# Patient Record
Sex: Female | Born: 1990 | Race: White | Hispanic: No | Marital: Married | Smoking: Never smoker
Health system: Southern US, Community
[De-identification: ages and names within clinical notes are randomized; demographics above are authoritative.]

## PROBLEM LIST (undated history)

## (undated) DIAGNOSIS — T7840XA Allergy, unspecified, initial encounter: Secondary | ICD-10-CM

## (undated) HISTORY — DX: Allergy, unspecified, initial encounter: T78.40XA

---
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
## 2010-11-16 ENCOUNTER — Encounter: Payer: Self-pay | Admitting: Internal Medicine

## 2011-06-03 ENCOUNTER — Ambulatory Visit (INDEPENDENT_AMBULATORY_CARE_PROVIDER_SITE_OTHER): Payer: BC Managed Care – PPO | Admitting: Internal Medicine

## 2011-06-03 ENCOUNTER — Encounter: Payer: Self-pay | Admitting: Internal Medicine

## 2011-06-03 DIAGNOSIS — J309 Allergic rhinitis, unspecified: Secondary | ICD-10-CM | POA: Insufficient documentation

## 2011-06-03 DIAGNOSIS — J45909 Unspecified asthma, uncomplicated: Secondary | ICD-10-CM | POA: Insufficient documentation

## 2011-06-03 MED ORDER — METHYLPREDNISOLONE ACETATE 80 MG/ML IJ SUSP
80.0000 mg | Freq: Once | INTRAMUSCULAR | Status: AC
  Administered 2011-06-03: 80 mg via INTRAMUSCULAR

## 2011-06-03 NOTE — Progress Notes (Signed)
Subjective:    Patient ID: Lindsey Glenn, female    DOB: [birth year], 20 y.o.   MRN: [id number]  HPI  New pt. Here for first visit.  Former pt.  Dillon is here with her mom.  She is having a lot of trouble with her allergies and has had to use her albuterol twice in the last 7 days.  Lots of eye itching, nasal congestion  "I sneezed a hundred times last night"  She is on Singulair and cetrizine but these are not helping.  She reports no audible wheezing and does not wake up at night coughing  Allergies  Allergen Reactions  . Penicillins Hives   Past Medical History  Diagnosis Date  . Allergy   . Asthma    Past Surgical History  Procedure Date  . Shoulder surgery 2007    Right   History   Social History  . Marital Status: Married    Spouse Name: N/A    Number of Children: N/A  . Years of Education: N/A   Occupational History  . Not on file.   Social History Main Topics  . Smoking status: Never Smoker   . Smokeless tobacco: Never Used  . Alcohol Use: Yes     socially  . Drug Use: No  . Sexually Active: Yes    Birth Control/ Protection: Pill   Other Topics Concern  . Not on file   Social History Narrative  . No narrative on file   Family History  Problem Relation Age of Onset  . Hypertension Maternal Grandfather   . Heart disease Paternal Grandmother     pacemaker   Patient Active Problem List  Diagnoses  . Asthma  . Allergic rhinitis   No current outpatient prescriptions on file prior to visit.       Review of Systems  See HPI     Objective:   Physical ExamPhysical Exam  Constitutional: She is oriented to person, place, and time. She appears well-developed and well-nourished. She is cooperative.  HENT:  Head: Normocephalic and atraumatic.  Right Ear: A middle ear effusion is present.  Left Ear: A middle ear effusion is present.  Nose: Mucosal edema present. Right sinus exhibits maxillary sinus tenderness. Left sinus exhibits maxillary  sinus tenderness.  Mouth/Throat: Posterior oropharyngeal erythema present.  Serous effusion bilaterally  Eyes: Conjunctivae and EOM are normal. Pupils are equal, round, and reactive to light.  Neck: Neck supple. Carotid bruit is not present. No mass present.  Cardiovascular: Regular rhythm, normal heart sounds, intact distal pulses and normal pulses. Exam reveals no gallop and no friction rub.  No murmur heard.  Pulmonary/Chest: Breath sounds normal. She has no wheezes. She has no rhonchi. She has no rales.  Neurological: She is alert and oriented to person, place, and time.  Skin: Skin is warm and dry. No abrasion, no bruising, no ecchymosis and no rash noted. No cyanosis. Nails show no clubbing.  Psychiatric: She has a normal mood and affect. Her speech is normal and behavior is normal.             Assessment & Plan:  1)  Allergic rhinitis  Will give Flonase nasal spray 2 sprays dialy for 2 weeks then 1 spray daily.  Stop certrizine and take Allegra D 24 hour  2)  Asthma -  Depomedrol 80 mg in office today and Prednisone 40 mg taper by 20 mg  q 3 days.  OK to continue Albuterol for rescue inhaler only  3)  Pharyngitis  Doxycycline 100mg  # 10 1 po bid for  5 days  Return to see me in 3 weeks

## 2011-06-03 NOTE — Patient Instructions (Signed)
Take medicine as prescribed.  Over the counter Allegra-D  24 hour each day at bedtime  See me in office in 2-3 weeks

## 2011-07-06 ENCOUNTER — Other Ambulatory Visit: Payer: Self-pay | Admitting: Internal Medicine

## 2011-07-06 DIAGNOSIS — N946 Dysmenorrhea, unspecified: Secondary | ICD-10-CM

## 2011-07-06 NOTE — Telephone Encounter (Signed)
Pt request refill request for Ibuprofen 800 mg tablets.  [facility] [place] [phone].  Call back number for pt is [phone].

## 2011-07-06 NOTE — Telephone Encounter (Signed)
Pt reports she uses ibuprofen 800mg  PRN menstrual cramping.  [facility]

## 2011-07-07 MED ORDER — IBUPROFEN 800 MG PO TABS: 800.0000 mg | ORAL_TABLET | Freq: Three times a day (TID) | ORAL | Status: DC | PRN

## 2011-08-02 ENCOUNTER — Telehealth: Payer: Self-pay | Admitting: Internal Medicine

## 2011-08-02 ENCOUNTER — Other Ambulatory Visit: Payer: Self-pay | Admitting: Internal Medicine

## 2011-08-02 NOTE — Telephone Encounter (Signed)
Please call my former practice to obtain vaccine records and record in pts chart   Thanks

## 2011-08-03 NOTE — Telephone Encounter (Signed)
Immunization history entered.  

## 2011-08-03 NOTE — Telephone Encounter (Signed)
Spoke with Rodell Perna who will fax vaccination records over

## 2011-08-20 ENCOUNTER — Ambulatory Visit (INDEPENDENT_AMBULATORY_CARE_PROVIDER_SITE_OTHER): Payer: BC Managed Care – PPO | Admitting: Emergency Medicine

## 2011-08-20 DIAGNOSIS — Z23 Encounter for immunization: Secondary | ICD-10-CM

## 2011-08-28 ENCOUNTER — Telehealth: Payer: Self-pay | Admitting: Emergency Medicine

## 2011-08-28 MED ORDER — NORETHINDRONE ACET-ETHINYL EST 1-20 MG-MCG PO TABS: 1.0000 | ORAL_TABLET | Freq: Every day | ORAL | Status: DC

## 2011-08-28 NOTE — Telephone Encounter (Signed)
Lindsey Glenn called, requesting refill of Kamillah's OCP for pick up today.  Okay per DDS to refill x 1 year.  [facility]

## 2012-05-21 ENCOUNTER — Other Ambulatory Visit: Payer: Self-pay | Admitting: Internal Medicine

## 2012-08-03 ENCOUNTER — Other Ambulatory Visit: Payer: Self-pay | Admitting: Internal Medicine

## 2012-08-03 NOTE — Telephone Encounter (Signed)
Pt states her birth control is out and she needs it refill by this upcoming Friday... She would like [facility] [place]... She states one can call her at [phone] if there any questions and concerns.Marland KitchenMarland Kitchen

## 2012-08-04 NOTE — Telephone Encounter (Signed)
Unable to reach pt, phone number disconnected cell phone listed is actually pharmacy #

## 2012-08-04 NOTE — Telephone Encounter (Signed)
Lindsey Glenn  Call pt and let her know that I refilled her OC's but I like to see my patients on birth control pills once a year.  Advise her to see me sometime before the end of the year.  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

## 2012-08-06 NOTE — Telephone Encounter (Signed)
Lindsey Glenn    Just leave a message on pt's home phone number

## 2012-08-17 ENCOUNTER — Telehealth: Payer: Self-pay | Admitting: Internal Medicine

## 2012-08-17 NOTE — Telephone Encounter (Signed)
Pt has problems with body temperature always cold... Would like to know if she should see a doctor... Please call pt at [phone]

## 2013-01-18 ENCOUNTER — Other Ambulatory Visit: Payer: Self-pay | Admitting: Internal Medicine

## 2013-01-18 NOTE — Telephone Encounter (Signed)
Will notify pt that she will need to make an OV to have her BC renewed.

## 2013-01-18 NOTE — Telephone Encounter (Signed)
Refill request

## 2013-01-26 ENCOUNTER — Other Ambulatory Visit: Payer: Self-pay | Admitting: Internal Medicine

## 2013-01-26 MED ORDER — NORETHINDRONE ACET-ETHINYL EST 1-20 MG-MCG PO TABS: ORAL_TABLET | ORAL | Status: DC

## 2013-03-09 ENCOUNTER — Ambulatory Visit (INDEPENDENT_AMBULATORY_CARE_PROVIDER_SITE_OTHER): Payer: BC Managed Care – PPO | Admitting: Internal Medicine

## 2013-03-09 ENCOUNTER — Encounter: Payer: Self-pay | Admitting: Internal Medicine

## 2013-03-09 VITALS — BP 101/74 | HR 63 | Temp 98.7°F | Ht 65.5 in | Wt 112.0 lb

## 2013-03-09 DIAGNOSIS — J45990 Exercise induced bronchospasm: Secondary | ICD-10-CM | POA: Insufficient documentation

## 2013-03-09 DIAGNOSIS — N946 Dysmenorrhea, unspecified: Secondary | ICD-10-CM

## 2013-03-09 LAB — CBC WITH DIFFERENTIAL/PLATELET
Basophils Absolute: 0 10*3/uL (ref 0.0–0.1)
Basophils Relative: 0 % (ref 0–1)
Eosinophils Relative: 2 % (ref 0–5)
HCT: 38 % (ref 36.0–46.0)
Hemoglobin: 13.1 g/dL (ref 12.0–15.0)
Monocytes Absolute: 0.3 10*3/uL (ref 0.1–1.0)
RBC: 4.38 MIL/uL (ref 3.87–5.11)
RDW: 13.6 % (ref 11.5–15.5)
WBC: 4.8 10*3/uL (ref 4.0–10.5)

## 2013-03-09 LAB — COMPREHENSIVE METABOLIC PANEL
Albumin: 4.1 g/dL (ref 3.5–5.2)
Alkaline Phosphatase: 32 U/L — ABNORMAL LOW (ref 39–117)
BUN: 12 mg/dL (ref 6–23)
Creat: 0.86 mg/dL (ref 0.50–1.10)
Glucose, Bld: 80 mg/dL (ref 70–99)
Potassium: 4.1 mEq/L (ref 3.5–5.3)
Total Protein: 6.6 g/dL (ref 6.0–8.3)

## 2013-03-09 LAB — TSH: TSH: 0.982 u[IU]/mL (ref 0.350–4.500)

## 2013-03-09 LAB — LIPID PANEL
LDL Cholesterol: 133 mg/dL — ABNORMAL HIGH (ref 0–99)
VLDL: 23 mg/dL (ref 0–40)

## 2013-03-09 MED ORDER — NORETHINDRONE ACET-ETHINYL EST 1-20 MG-MCG PO TABS: ORAL_TABLET | ORAL | Status: DC

## 2013-03-09 MED ORDER — ALBUTEROL SULFATE HFA 108 (90 BASE) MCG/ACT IN AERS: 2.0000 | INHALATION_SPRAY | Freq: Four times a day (QID) | RESPIRATORY_TRACT | Status: DC | PRN

## 2013-03-09 NOTE — Progress Notes (Signed)
Needs refill on birth control and inhaler

## 2013-03-09 NOTE — Progress Notes (Signed)
Subjective:    Patient ID: Lindsey Glenn, female    DOB: [birth year], 22 y.o.   MRN: [id number]  HPI  [name] is here for follow up.   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  NO real pain.  She is not in a sexual relationship now but has been at college.  She was using condoms for STD protection  Has rarely needed her proventil.  Uses at times prior to exercise   Allergies  Allergen Reactions  . Penicillins Hives  . Cherry Rash   Past Medical History  Diagnosis Date  . Allergy   . Asthma    Past Surgical History  Procedure Laterality Date  . Shoulder surgery  2007    Right   History   Social History  . Marital Status: Married    Spouse Name: N/A    Number of Children: N/A  . Years of Education: N/A   Occupational History  . Not on file.   Social History Main Topics  . Smoking status: Never Smoker   . Smokeless tobacco: Never Used  . Alcohol Use: Yes     Comment: socially  . Drug Use: No  . Sexually Active: Yes    Birth Control/ Protection: Pill   Other Topics Concern  . Not on file   Social History Narrative  . No narrative on file   Family History  Problem Relation Age of Onset  . Hypertension Maternal Grandfather   . Heart disease Paternal Grandmother     pacemaker  . Hyperlipidemia Mother    Patient Active Problem List   Diagnosis Date Noted  . Asthma 06/03/2011  . Allergic rhinitis 06/03/2011   Current Outpatient Prescriptions on File Prior to Visit  Medication Sig Dispense Refill  . albuterol (PROVENTIL,VENTOLIN) 90 MCG/ACT inhaler Inhale 2 puffs into the lungs every 6 (six) hours as needed.        . cetirizine (ZYRTEC) 10 MG tablet Take 10 mg by mouth daily as needed.       Marland Kitchen ibuprofen (ADVIL,MOTRIN) 800 MG tablet Take 1 tablet (800 mg total) by mouth every 8  (eight) hours as needed. Menstrual cramps  30 tablet  2  . montelukast (SINGULAIR) 10 MG tablet Take 10 mg by mouth daily as needed.        No current facility-administered medications on file prior to visit.        Review of Systems    see HPI Objective:   Physical Exam Physical Exam  Nursing note and vitals reviewed.  Constitutional: She is oriented to person, place, and time. She appears well-developed and well-nourished.  HENT:  Head: Normocephalic and atraumatic.  Cardiovascular: Normal rate and regular rhythm. Exam reveals no gallop and no friction rub.  No murmur heard.  Pulmonary/Chest: Breath sounds normal. She has no wheezes. She has no rales.  Neurological: She is alert and oriented to person, place, and time.  Skin: Skin is warm and dry.  Psychiatric: She has a normal mood and affect. Her behavior is normal.             Assessment & Plan:  Dysmenorrhea  OK to continue Junel.  Pt counseled if any leg swelling or pain she is to call office.   She is aware of risk of blood clots  Exercise induced bronchospasm.  OK to refill Proventil  Will return next week for pap

## 2013-03-13 ENCOUNTER — Telehealth: Payer: Self-pay | Admitting: *Deleted

## 2013-03-13 ENCOUNTER — Encounter: Payer: Self-pay | Admitting: *Deleted

## 2013-03-13 MED ORDER — NORETHINDRONE ACET-ETHINYL EST 1-20 MG-MCG PO TABS: ORAL_TABLET | ORAL | Status: DC

## 2013-03-13 NOTE — Telephone Encounter (Signed)
Refill request pharmacy requested a 90 day supply changed RX for 90 tablets with 2 refills vs 30 tab with 5 refills

## 2013-03-16 ENCOUNTER — Ambulatory Visit (INDEPENDENT_AMBULATORY_CARE_PROVIDER_SITE_OTHER): Payer: BC Managed Care – PPO | Admitting: Internal Medicine

## 2013-03-16 ENCOUNTER — Encounter: Payer: Self-pay | Admitting: Internal Medicine

## 2013-03-16 VITALS — BP 95/62 | HR 65 | Temp 97.1°F | Resp 18 | Wt 107.0 lb

## 2013-03-16 DIAGNOSIS — Z01419 Encounter for gynecological examination (general) (routine) without abnormal findings: Secondary | ICD-10-CM

## 2013-03-16 DIAGNOSIS — Z124 Encounter for screening for malignant neoplasm of cervix: Secondary | ICD-10-CM

## 2013-03-16 DIAGNOSIS — Z113 Encounter for screening for infections with a predominantly sexual mode of transmission: Secondary | ICD-10-CM

## 2013-03-16 NOTE — Progress Notes (Signed)
Subjective:    Patient ID: Lindsey Glenn, female    DOB: 10/27/1990, 22 y.o.   MRN: [id number]  HPI Lindsey Glenn is here for annual pelvic.  She reports she is not sexually active now.  She is on last days of current menses.  No pain.    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 Penicillins Hives  . Cherry Rash   Past Medical History  Diagnosis Date  . Allergy   . Asthma    Past Surgical History  Procedure Laterality Date  . Shoulder surgery  2007    Right   History   Social History  . Marital Status: Married    Spouse Name: N/A    Number of Children: N/A  . Years of Education: N/A   Occupational History  . Not on file.   Social History Main Topics  . Smoking status: Never Smoker   . Smokeless tobacco: Never Used  . Alcohol Use: Yes     Comment: socially  . Drug Use: No  . Sexually Active: Yes    Birth Control/ Protection: Pill   Other Topics Concern  . Not on file   Social History Narrative  . No narrative on file   Family History  Problem Relation Age of Onset  . Hypertension Maternal Grandfather   . Heart disease Paternal Grandmother     pacemaker  . Hyperlipidemia Mother    Patient Active Problem List   Diagnosis Date Noted  . 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 Asthma 06/03/2011  . Allergic rhinitis 06/03/2011   Current Outpatient Prescriptions on File Prior to Visit  Medication Sig Dispense Refill  . albuterol (PROVENTIL HFA;VENTOLIN HFA) 108 (90 BASE) MCG/ACT inhaler Inhale 2 puffs into the lungs every 6 (six) hours as needed for wheezing.  1 Inhaler  2  . albuterol (PROVENTIL,VENTOLIN) 90 MCG/ACT inhaler Inhale 2 puffs into the lungs every 6 (six) hours as needed.        . cetirizine (ZYRTEC) 10 MG tablet Take 10 mg by mouth daily as needed.       . norethindrone-ethinyl estradiol (JUNEL 1/20) 1-20 MG-MCG tablet Take one daily  90 tablet  2  . ibuprofen  (ADVIL,MOTRIN) 800 MG tablet Take 1 tablet (800 mg total) by mouth every 8 (eight) hours as needed. Menstrual cramps  30 tablet  2   No current facility-administered medications on file prior to visit.       Review of Systems See HPI    Objective:   Physical Exam Physical Exam  Nursing note and vitals reviewed.  Constitutional: She is oriented to person, place, and time. She appears well-developed and well-nourished.  HENT:  Head: Normocephalic and atraumatic.  Cardiovascular: Normal rate and regular rhythm. Exam reveals no gallop and no friction rub.  No murmur heard.  Pulmonary/Chest: Breath sounds normal. She has no wheezes. She has no rales.  Pelvic:  Normal  external genitalia Vagina no lesion Cervix  No lesions pap obtained No adnexal masses or tenderness Neurological: She is alert and oriented to person, place, and time.  Skin: Skin is warm and dry.  Psychiatric: She has a normal mood and affect. Her behavior is normal.             Assessment & Plan:  Annual exam  Pap, G/C chlamydia today  Counseled safer sex practices/condom use.   Will reorder her OC's  See me as needed 

## 2013-03-16 NOTE — Patient Instructions (Addendum)
See me as needed 

## 2013-03-23 ENCOUNTER — Encounter: Payer: Self-pay | Admitting: *Deleted

## 2013-03-28 ENCOUNTER — Ambulatory Visit: Payer: Self-pay

## 2013-03-28 ENCOUNTER — Other Ambulatory Visit: Payer: Self-pay | Admitting: Occupational Medicine

## 2013-03-28 DIAGNOSIS — R52 Pain, unspecified: Secondary | ICD-10-CM

## 2013-08-07 ENCOUNTER — Ambulatory Visit (INDEPENDENT_AMBULATORY_CARE_PROVIDER_SITE_OTHER): Payer: BC Managed Care – PPO | Admitting: *Deleted

## 2013-08-07 DIAGNOSIS — Z23 Encounter for immunization: Secondary | ICD-10-CM

## 2013-08-07 NOTE — Addendum Note (Signed)
Addended by: Clint Bolder D on: 08/07/2013 04:30 PM   Modules accepted: Orders

## 2013-08-09 ENCOUNTER — Telehealth: Payer: Self-pay | Admitting: *Deleted

## 2013-08-09 ENCOUNTER — Encounter: Payer: Self-pay | Admitting: Family

## 2013-08-09 ENCOUNTER — Ambulatory Visit (INDEPENDENT_AMBULATORY_CARE_PROVIDER_SITE_OTHER): Payer: BC Managed Care – PPO | Admitting: Family

## 2013-08-09 VITALS — BP 88/68 | HR 81 | Temp 98.4°F | Resp 16 | Wt 106.1 lb

## 2013-08-09 DIAGNOSIS — A088 Other specified intestinal infections: Secondary | ICD-10-CM

## 2013-08-09 DIAGNOSIS — A084 Viral intestinal infection, unspecified: Secondary | ICD-10-CM

## 2013-08-09 MED ORDER — ONDANSETRON HCL 4 MG PO TABS: 4.0000 mg | ORAL_TABLET | Freq: Three times a day (TID) | ORAL | Status: DC | PRN

## 2013-08-09 NOTE — Progress Notes (Signed)
Subjective:    Patient ID: Lindsey Glenn, female    DOB: 11-Oct-1991, 22 y.o.   MRN: [id number]  HPI  Ms. Ryen is a 22 yr old female patient of Dr. Lonell Face who presents today with chief complaint of nausea.  Reports that she woke up at 3 AM with nausea and vomiting. She noted brief sob and chest discomfort which was associated with the vomiting and has resolved.  She did have chills and felt feverish but did not take her temperature.  She denies associated abdominal pain, dysuria, back pain, cough or known sick contacts.  She reports that she drove to [place], [place] last week and flew to [place] the week before. She denies calf pain/swelling.  She is not sure re: family hx of DVT.  She took ondansetron earlier today.  This helped her nausea some.     Review of Systems See HPI  Past Medical History  Diagnosis Date  . Allergy   . Asthma     History   Social History  . Marital Status: Married    Spouse Name: N/A    Number of Children: N/A  . Years of Education: N/A   Occupational History  . Not on file.   Social History Main Topics  . Smoking status: Never Smoker   . Smokeless tobacco: Never Used  . Alcohol Use: Yes     Comment: socially  . Drug Use: No  . Sexual Activity: Yes    Birth Control/ Protection: Pill   Other Topics Concern  . Not on file   Social History Narrative  . No narrative on file    Past Surgical History  Procedure Laterality Date  . Shoulder surgery  2007    Right    Family History  Problem Relation Age of Onset  . Hypertension Maternal Grandfather   . Heart disease Paternal Grandmother     pacemaker  . Hyperlipidemia Mother     Allergies  Allergen Reactions  . Penicillins Hives  . Cherry Rash    Current Outpatient Prescriptions on File Prior to Visit  Medication Sig Dispense Refill  . albuterol (PROVENTIL HFA;VENTOLIN HFA) 108 (90 BASE) MCG/ACT inhaler Inhale 2 puffs into the lungs every 6 (six) hours as needed for wheezing.  1  Inhaler  2  . norethindrone-ethinyl estradiol (JUNEL 1/20) 1-20 MG-MCG tablet Take one daily  90 tablet  2  . cetirizine (ZYRTEC) 10 MG tablet Take 10 mg by mouth daily as needed.       Marland Kitchen ibuprofen (ADVIL,MOTRIN) 800 MG tablet Take 1 tablet (800 mg total) by mouth every 8 (eight) hours as needed. Menstrual cramps  30 tablet  2   No current facility-administered medications on file prior to visit.    BP 88/68  Pulse 81  Temp(Src) 98.4 F (36.9 C) (Oral)  Resp 16  Wt 106 lb 1.3 oz (48.118 kg)  BMI 17.38 kg/m2  SpO2 99%       Objective:   Physical Exam  Constitutional: She is oriented to person, place, and time. She appears well-developed and well-nourished.  HENT:  Head: Normocephalic and atraumatic.  Right Ear: Tympanic membrane and ear canal normal.  Left Ear: Tympanic membrane and ear canal normal.  Neck:  Slightly prominent thyroid, no palpable nodules noted.   Cardiovascular: Normal rate and regular rhythm.   No murmur heard. Pulmonary/Chest: Effort normal and breath sounds normal. No respiratory distress. She has no wheezes. She has no rales. She exhibits no tenderness.  Neurological: She is alert and oriented to person, place, and time.  Skin: Skin is warm and dry.  Psychiatric: She has a normal mood and affect. Her behavior is normal. Judgment and thought content normal.          Assessment & Plan:

## 2013-08-09 NOTE — Telephone Encounter (Signed)
Pt woke this am with N/V chills and fever pt had the flu vaccination on Monday appt with Macario Carls this am at 945. Mother contacted

## 2013-08-09 NOTE — Patient Instructions (Signed)
Viral Gastroenteritis Viral gastroenteritis is also known as stomach flu. This condition affects the stomach and intestinal tract. It can cause sudden diarrhea and vomiting. The illness typically lasts 3 to 8 days. Most people develop an immune response that eventually gets rid of the virus. While this natural response develops, the virus can make you quite ill. CAUSES  Many different viruses can cause gastroenteritis, such as rotavirus or noroviruses. You can catch one of these viruses by consuming contaminated food or water. You may also catch a virus by sharing utensils or other personal items with an infected person or by touching a contaminated surface. SYMPTOMS  The most common symptoms are diarrhea and vomiting. These problems can cause a severe loss of body fluids (dehydration) and a body salt (electrolyte) imbalance. Other symptoms may include:  Fever.  Headache.  Fatigue.  Abdominal pain. DIAGNOSIS  Your caregiver can usually diagnose viral gastroenteritis based on your symptoms and a physical exam. A stool sample may also be taken to test for the presence of viruses or other infections. TREATMENT  This illness typically goes away on its own. Treatments are aimed at rehydration. The most serious cases of viral gastroenteritis involve vomiting so severely that you are not able to keep fluids down. In these cases, fluids must be given through an intravenous line (IV). HOME CARE INSTRUCTIONS   Drink enough fluids to keep your urine clear or pale yellow. Drink small amounts of fluids frequently and increase the amounts as tolerated.  Ask your caregiver for specific rehydration instructions.  Avoid:  Foods high in sugar.  Alcohol.  Carbonated drinks.  Tobacco.  Juice.  Caffeine drinks.  Extremely hot or cold fluids.  Fatty, greasy foods.  Too much intake of anything at one time.  Dairy products until 24 to 48 hours after diarrhea stops.  You may consume probiotics.  Probiotics are active cultures of beneficial bacteria. They may lessen the amount and number of diarrheal stools in adults. Probiotics can be found in yogurt with active cultures and in supplements.  Wash your hands well to avoid spreading the virus.  Only take over-the-counter or prescription medicines for pain, discomfort, or fever as directed by your caregiver. Do not give aspirin to children. Antidiarrheal medicines are not recommended.  Ask your caregiver if you should continue to take your regular prescribed and over-the-counter medicines.  Keep all follow-up appointments as directed by your caregiver. SEEK IMMEDIATE MEDICAL CARE IF:   You are unable to keep fluids down.  You do not urinate at least once every 6 to 8 hours.  You develop shortness of breath.  You notice blood in your stool or vomit. This may look like coffee grounds.  You have abdominal pain that increases or is concentrated in one small area (localized).  You have persistent vomiting or diarrhea.  You have a fever.  The patient is a child younger than 3 months, and he or she has a fever.  The patient is a child older than 3 months, and he or she has a fever and persistent symptoms.  The patient is a child older than 3 months, and he or she has a fever and symptoms suddenly get worse.  The patient is a baby, and he or she has no tears when crying. MAKE SURE YOU:   Understand these instructions.  Will watch your condition.  Will get help right away if you are not doing well or get worse. Document Released: 10/12/2005 Document Revised: 01/04/2012 Document Reviewed: 07/29/2011   [employer] Patient Information 2014 [employer]  

## 2013-08-09 NOTE — Assessment & Plan Note (Signed)
22 yr old female with symptoms consistent with viral gastroenteritis.  Recommend that she continue zofran prn, frequent small sips of liquids.  Tylenol or motrin as needed for comfort/fever.  We did discuss that if she has recurrent chest pain or shortness of breath she should notify us as we would want to exclude PE and she verbalizes understanding  However, at this point, I think PE is highly unlikely and that her brief sob and chest discomfort was a direct result of vomitting.

## 2013-09-28 ENCOUNTER — Encounter: Payer: Self-pay | Admitting: Internal Medicine

## 2013-09-28 ENCOUNTER — Ambulatory Visit (INDEPENDENT_AMBULATORY_CARE_PROVIDER_SITE_OTHER): Payer: BC Managed Care – PPO | Admitting: Internal Medicine

## 2013-09-28 VITALS — BP 92/63 | HR 57 | Temp 98.1°F | Resp 18 | Wt 108.0 lb

## 2013-09-28 DIAGNOSIS — L989 Disorder of the skin and subcutaneous tissue, unspecified: Secondary | ICD-10-CM

## 2013-09-28 DIAGNOSIS — J45990 Exercise induced bronchospasm: Secondary | ICD-10-CM

## 2013-09-28 MED ORDER — ALBUTEROL SULFATE HFA 108 (90 BASE) MCG/ACT IN AERS: 2.0000 | INHALATION_SPRAY | Freq: Four times a day (QID) | RESPIRATORY_TRACT | Status: AC | PRN

## 2013-09-28 MED ORDER — NORETHINDRONE ACET-ETHINYL EST 1-20 MG-MCG PO TABS: ORAL_TABLET | ORAL | Status: DC

## 2013-09-28 NOTE — Progress Notes (Signed)
Subjective:    Patient ID: Lindsey Glenn, female    DOB: 1991/09/03, 22 y.o.   MRN: [id number]  HPI Jashayla is here for acute visit for two skin lesions and to discuss her exercise induced bronchospasm  She has  Papule on her Leftd hip that has been present for some time .   Not reddened not increasing in size.     She has also noticed  A small lesion Top or her R leg in groin area that has been there for quite some time  She reports she needs to use her albuterol when her father smokes  a cigar every Sunday.  Occasionally will need albuterol when going into cold air.  Last use  5 days ago when exposed to cigar smoke  Allergies  Allergen Reactions  . Penicillins Hives  . Cherry Rash   Past Medical History  Diagnosis Date  . Allergy   . Asthma    Past Surgical History  Procedure Laterality Date  . Shoulder surgery  2007    Right   History   Social History  . Marital Status: Married    Spouse Name: N/A    Number of Children: N/A  . Years of Education: N/A   Occupational History  . Not on file.   Social History Main Topics  . Smoking status: Never Smoker   . Smokeless tobacco: Never Used  . Alcohol Use: Yes     Comment: socially  . Drug Use: No  . Sexual Activity: Yes    Birth Control/ Protection: Pill   Other Topics Concern  . Not on file   Social History Narrative  . No narrative on file   Family History  Problem Relation Age of Onset  . Hypertension Maternal Grandfather   . Heart disease Paternal Grandmother     pacemaker  . Hyperlipidemia Mother    Patient Active Problem List   Diagnosis Date Noted  . Viral gastroenteritis 08/09/2013  . 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 Asthma 06/03/2011  . Allergic rhinitis 06/03/2011   Current Outpatient Prescriptions on File Prior to Visit  Medication Sig Dispense Refill  . Acetaminophen (TYLENOL EXTRA STRENGTH PO) Take by mouth as needed.      Marland Kitchen albuterol (PROVENTIL HFA;VENTOLIN HFA) 108  (90 BASE) MCG/ACT inhaler Inhale 2 puffs into the lungs every 6 (six) hours as needed for wheezing.  1 Inhaler  2  . fexofenadine-pseudoephedrine (ALLEGRA-D 24) 180-240 MG per 24 hr tablet Take 1 tablet by mouth daily as needed.      Marland Kitchen ibuprofen (ADVIL,MOTRIN) 800 MG tablet Take 1 tablet (800 mg total) by mouth every 8 (eight) hours as needed. Menstrual cramps  30 tablet  2  . norethindrone-ethinyl estradiol (JUNEL 1/20) 1-20 MG-MCG tablet Take one daily  90 tablet  2  . cetirizine (ZYRTEC) 10 MG tablet Take 10 mg by mouth daily as needed.       . ondansetron (ZOFRAN) 4 MG tablet Take 1 tablet (4 mg total) by mouth every 8 (eight) hours as needed for nausea.  20 tablet  0   No current facility-administered medications on file prior to visit.      Review of Systems See HPI    Objective:   Physical Exam  Physical Exam  Nursing note and vitals reviewed.  Constitutional: She is oriented to person, place, and time. She appears well-developed and well-nourished.  HENT:  Head: Normocephalic and atraumatic.  Cardiovascular: Normal rate and regular rhythm. Exam  reveals no gallop and no friction rub.  No murmur heard.  Pulmonary/Chest: Breath sounds normal. She has no wheezes. She has no rales.  Neurological: She is alert and oriented to person, place, and time.  Skin: Skin is warm and dry.  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 Her behavior is normal.            Assessment & Plan:  Skin lesion:  She would like to see a dermatologist    OK to refer    Exercise induced Asthma:    OK to refill Proventil.  Advised if using rescue inhaler more than 3 times in 24 hours, to see me in office  See me as needed

## 2013-09-28 NOTE — Patient Instructions (Signed)
Will refer to dermatologist   Dr. Emily Filbert or Dr. Danella Deis    See me as needed

## 2013-11-22 ENCOUNTER — Other Ambulatory Visit: Payer: Self-pay | Admitting: Internal Medicine

## 2013-11-22 NOTE — Telephone Encounter (Signed)
Refill request

## 2014-01-19 ENCOUNTER — Telehealth: Payer: Self-pay | Admitting: *Deleted

## 2014-01-19 NOTE — Telephone Encounter (Signed)
Pt called wanting a RX  For steroids and an inhaler after [facility]. Pt was told to contact her PCP for RX. Notified pt that Dr Coralyn Mark was not in the office today and that she will need to be seen at [facility] where they could write her a RX for the appropriate medications, as Dr Coralyn Mark does not prescribe medications unless she has evaluated pt. Pt verbalized understanding, and be seen at an UC today.

## 2014-01-22 ENCOUNTER — Telehealth: Payer: Self-pay | Admitting: *Deleted

## 2014-01-22 NOTE — Telephone Encounter (Signed)
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  The doctor wanted her to call her primary care and see if we would call in a rx for steroids to help her get over reaction to cat.

## 2014-02-07 ENCOUNTER — Ambulatory Visit (INDEPENDENT_AMBULATORY_CARE_PROVIDER_SITE_OTHER): Payer: BC Managed Care – PPO | Admitting: Podiatry

## 2014-02-07 ENCOUNTER — Encounter: Payer: Self-pay | Admitting: Podiatry

## 2014-02-07 VITALS — BP 109/65 | HR 74 | Ht 65.5 in | Wt 108.0 lb

## 2014-02-07 DIAGNOSIS — L600 Ingrowing nail: Secondary | ICD-10-CM

## 2014-02-07 DIAGNOSIS — D1800 Hemangioma unspecified site: Secondary | ICD-10-CM

## 2014-02-07 DIAGNOSIS — M79609 Pain in unspecified limb: Secondary | ICD-10-CM

## 2014-02-07 DIAGNOSIS — L03039 Cellulitis of unspecified toe: Secondary | ICD-10-CM

## 2014-02-07 DIAGNOSIS — M79674 Pain in right toe(s): Secondary | ICD-10-CM

## 2014-02-07 NOTE — Patient Instructions (Signed)
Seen for abnormal nail on right great toe. Nail and small growth tissue removed.  Keep bandage till the next appointment Monday.

## 2014-02-07 NOTE — Progress Notes (Signed)
Subjective: 23 year old female presents accompanied by her mother with a painful toe. She has an unusual bleeding spot at the base of the right great toe nail  She recall having an injury to the toe a few weeks ago.  Objective: Positive of red granular growth protruding from the dorsal aspect of  nail base at proximal skin folder with moist nail base. No foul odor. No expanded edema or erythema associated with the lesion. Neurovascular status are within normal. Rectus foot without growth deformity.  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   Plan: Reviewed clinical findings and available options. Patient and her mother agreed to have it excised or removed surgically.  Procedure done: Excision of glomus tumor with nail avulsion right hallux. Affected toe was anesthetized with total 16ml mixture of 50/50 0.5% Marcaine plain and 1% Xylocaine plain. Affected nail plate was reflected with nail elevator and plate was removed.  The gross was noted to be from dorsal surface of skin folder of nail base line. Proximal skin incision was placed along the line of medical nail border and visualized the nail matrix area.  Area was examined and noted of soft tissue growth from the proximal skin folder.  This growth was expanded about 70% of the base line of skin folder. Soft tissue was excised with # 15 blade. Area cleansed with Iodine. Medial skin incision was sutured with 4/0 Nylon and followed with compression bandage with Amerigel ointment.  Patient was instructed not to disturb the bandage. Will follow up in 5 days, next Monday.

## 2014-02-08 ENCOUNTER — Telehealth: Payer: Self-pay | Admitting: *Deleted

## 2014-02-08 NOTE — Telephone Encounter (Signed)
Dr. Caffie Pinto, The Rx written couldn't be filled, could you change Please?

## 2014-02-12 ENCOUNTER — Ambulatory Visit (INDEPENDENT_AMBULATORY_CARE_PROVIDER_SITE_OTHER): Payer: BC Managed Care – PPO | Admitting: Podiatry

## 2014-02-12 ENCOUNTER — Encounter: Payer: Self-pay | Admitting: Podiatry

## 2014-02-12 VITALS — BP 132/85 | HR 88

## 2014-02-12 DIAGNOSIS — D1800 Hemangioma unspecified site: Secondary | ICD-10-CM

## 2014-02-12 MED ORDER — HYDROCODONE-ACETAMINOPHEN 7.5-325 MG PO TABS: 1.0000 | ORAL_TABLET | Freq: Four times a day (QID) | ORAL | Status: DC | PRN

## 2014-02-12 NOTE — Patient Instructions (Signed)
Post op check. Wound is still fresh and wet from school incident.  Will remove suture this Friday. May get it wet before come for the appointment.

## 2014-02-12 NOTE — Progress Notes (Signed)
Been having pain. Was doing well till a child stepped on her toe today.  Dressing removed. Base of the nail is still bleeding at the suture site. Dressing is adhered to the nail bed.  Assessment: Status post nail and excision of soft tissue matter.  Dressing changed. Will remove suture on next visit. Patient is to keep the dressing another 4 days.

## 2014-02-15 ENCOUNTER — Encounter: Payer: Self-pay | Admitting: *Deleted

## 2014-02-16 ENCOUNTER — Encounter: Payer: Self-pay | Admitting: Podiatry

## 2014-02-16 ENCOUNTER — Ambulatory Visit (INDEPENDENT_AMBULATORY_CARE_PROVIDER_SITE_OTHER): Payer: BC Managed Care – PPO | Admitting: Podiatry

## 2014-02-16 VITALS — BP 114/66 | HR 61

## 2014-02-16 DIAGNOSIS — Z9889 Other specified postprocedural states: Secondary | ICD-10-CM

## 2014-02-16 NOTE — Patient Instructions (Signed)
Healing well. Continue to cover the area with band aid and antibiotic ointment till tenderness subside. Return as needed.

## 2014-02-16 NOTE — Progress Notes (Signed)
Patient came back to have sutures removed from right great toe nail surgery. Wound has healed well. Still gauze stuck to nail bed.  Suture removed. Patient is to keep Neosporin ointment to nail bed and cover it till pain subside. Return if there is any problem.

## 2014-02-27 ENCOUNTER — Encounter: Payer: Self-pay | Admitting: Podiatry

## 2014-02-27 ENCOUNTER — Ambulatory Visit (INDEPENDENT_AMBULATORY_CARE_PROVIDER_SITE_OTHER): Payer: BC Managed Care – PPO | Admitting: Podiatry

## 2014-02-27 VITALS — BP 119/72 | HR 70

## 2014-02-27 DIAGNOSIS — Z9889 Other specified postprocedural states: Secondary | ICD-10-CM

## 2014-02-27 NOTE — Progress Notes (Signed)
Patient was concerned about nail bed that had brown scab. Area cleansed.  Adhered old dressing removed. Good wound healing with new nail bed noted. Return as needed.

## 2014-02-27 NOTE — Patient Instructions (Signed)
Follow up since nail surgery. Area healing well. Return if needed.

## 2014-03-05 ENCOUNTER — Telehealth: Payer: Self-pay | Admitting: *Deleted

## 2014-03-05 NOTE — Telephone Encounter (Signed)
03/05/14 Dr. Caffie Pinto , Pt wants to know if she can get in pool just to cool off. She had a toenail removed a few weeks ago and it hasn't grew back and pt wanted to make sure this is ok. Please ask the Dr. She said.

## 2014-08-16 ENCOUNTER — Ambulatory Visit (INDEPENDENT_AMBULATORY_CARE_PROVIDER_SITE_OTHER): Payer: BC Managed Care – PPO | Admitting: *Deleted

## 2014-08-16 DIAGNOSIS — Z23 Encounter for immunization: Secondary | ICD-10-CM

## 2014-09-10 ENCOUNTER — Telehealth: Payer: Self-pay

## 2014-09-10 NOTE — Telephone Encounter (Signed)
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

## 2014-09-12 ENCOUNTER — Ambulatory Visit (INDEPENDENT_AMBULATORY_CARE_PROVIDER_SITE_OTHER): Payer: BC Managed Care – PPO | Admitting: Internal Medicine

## 2014-09-12 ENCOUNTER — Encounter: Payer: Self-pay | Admitting: Internal Medicine

## 2014-09-12 VITALS — BP 106/67 | HR 99 | Temp 98.3°F | Resp 16 | Ht 65.5 in | Wt 114.0 lb

## 2014-09-12 DIAGNOSIS — Z0100 Encounter for examination of eyes and vision without abnormal findings: Secondary | ICD-10-CM

## 2014-09-12 DIAGNOSIS — J4520 Mild intermittent asthma, uncomplicated: Secondary | ICD-10-CM | POA: Diagnosis not present

## 2014-09-12 DIAGNOSIS — Z0110 Encounter for examination of ears and hearing without abnormal findings: Secondary | ICD-10-CM

## 2014-09-12 DIAGNOSIS — N946 Dysmenorrhea, unspecified: Secondary | ICD-10-CM | POA: Diagnosis not present

## 2014-09-12 DIAGNOSIS — Z111 Encounter for screening for respiratory tuberculosis: Secondary | ICD-10-CM

## 2014-09-12 NOTE — Patient Instructions (Signed)
Schedule CPE

## 2014-09-12 NOTE — Progress Notes (Signed)
Subjective:    Patient ID: Lindsey Glenn, female    DOB: Feb 28, 1991, 23 y.o.   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  School quite dusty      Allergies  Allergen Reactions  . Penicillins Hives  . Cherry Rash  . Red Dye Rash   Past Medical History  Diagnosis Date  . Allergy   . Asthma    Past Surgical History  Procedure Laterality Date  . Shoulder surgery  2007    Right   History   Social History  . Marital Status: Married    Spouse Name: N/A    Number of Children: N/A  . Years of Education: N/A   Occupational History  . Not on file.   Social History Main Topics  . Smoking status: Never Smoker   . Smokeless tobacco: Never Used  . Alcohol Use: Yes     Comment: socially  . Drug Use: No  . Sexual Activity: Yes    Birth Control/ Protection: Pill   Other Topics Concern  . Not on file   Social History Narrative   Family History  Problem Relation Age of Onset  . Hypertension Maternal Grandfather   . Heart disease Paternal Grandmother     pacemaker  . Hyperlipidemia Mother    Patient Active Problem List   Diagnosis Date Noted  . Status post nail surgery 02/16/2014  . Glomus tumor 02/07/2014  . Pain in toe of right foot 02/07/2014  . Viral gastroenteritis 08/09/2013  . 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 Asthma 06/03/2011  . Allergic rhinitis 06/03/2011   Current Outpatient Prescriptions on File Prior to Visit  Medication Sig Dispense Refill  . Acetaminophen (TYLENOL EXTRA STRENGTH PO) Take by mouth as needed.    Marland Kitchen albuterol (PROVENTIL HFA;VENTOLIN HFA) 108 (90 BASE) MCG/ACT  inhaler Inhale 2 puffs into the lungs every 6 (six) hours as needed for wheezing. 1 Inhaler 1  . fexofenadine-pseudoephedrine (ALLEGRA-D 24) 180-240 MG per 24 hr tablet Take 1 tablet by mouth daily as needed.    . Fish Oil-Cholecalciferol (FISH OIL + D3 PO) Take 1 tablet by mouth daily.    Lenda Kelp 1/20 1-20 MG-MCG tablet TAKE 1 TABLET BY MOUTH EVERY DAY 63 tablet 5  . MULTIPLE VITAMIN PO Take 1 tablet by mouth.     No current facility-administered medications on file prior to visit.      Review of Systems    see HPI Objective:   Physical Exam Physical Exam  Nursing note and vitals reviewed.  Constitutional: She is oriented to person, place, and time. She appears well-developed and well-nourished.  HENT:  Head: Normocephalic and atraumatic.  Cardiovascular: Normal rate and regular rhythm. Exam reveals no gallop and no friction rub.  No murmur heard.  Pulmonary/Chest: Breath sounds normal. She has no wheezes. She has no rales.  Neurological: She is alert and oriented to person, place, and time.  Skin: Skin is warm and dry.  Psychiatric: She has a normal mood and affect. Her behavior is normal.  Assessment & Plan:  EIA  Continue albuterol prn   Dysmenorrhea  Continue OC's    See me as needed

## 2014-09-14 ENCOUNTER — Encounter: Payer: Self-pay | Admitting: *Deleted

## 2014-09-14 ENCOUNTER — Ambulatory Visit (INDEPENDENT_AMBULATORY_CARE_PROVIDER_SITE_OTHER): Payer: BC Managed Care – PPO | Admitting: *Deleted

## 2014-09-14 DIAGNOSIS — Z111 Encounter for screening for respiratory tuberculosis: Secondary | ICD-10-CM

## 2014-09-14 DIAGNOSIS — Z7689 Persons encountering health services in other specified circumstances: Secondary | ICD-10-CM

## 2014-09-14 LAB — TB SKIN TEST
INDURATION: 0 mm
TB SKIN TEST: NEGATIVE

## 2014-09-14 NOTE — Progress Notes (Signed)
   Subjective:    Patient ID: Lindsey Glenn, female    DOB: 1991-08-08, 23 y.o.   MRN: [id number]  HPI Lindsey Glenn is here today to have her PPD read. It is negative   Review of Systems     Objective:   Physical Exam        Assessment & Plan:

## 2014-12-17 ENCOUNTER — Ambulatory Visit: Payer: Self-pay | Admitting: Podiatry

## 2015-01-13 ENCOUNTER — Other Ambulatory Visit: Payer: Self-pay | Admitting: Internal Medicine

## 2015-01-14 NOTE — Telephone Encounter (Signed)
Refill request

## 2015-02-18 ENCOUNTER — Other Ambulatory Visit: Payer: Self-pay | Admitting: *Deleted

## 2017-05-13 ENCOUNTER — Other Ambulatory Visit: Payer: Self-pay | Admitting: Internal Medicine

## 2017-05-13 ENCOUNTER — Other Ambulatory Visit (HOSPITAL_COMMUNITY)
Admission: RE | Admit: 2017-05-13 | Discharge: 2017-05-13 | Disposition: A | Discharge status: Home / Self Care | Payer: BC Managed Care – PPO | Source: Ambulatory Visit | Attending: Internal Medicine | Admitting: Internal Medicine

## 2017-05-13 DIAGNOSIS — Z01419 Encounter for gynecological examination (general) (routine) without abnormal findings: Secondary | ICD-10-CM | POA: Diagnosis not present

## 2017-05-17 LAB — CYTOLOGY - PAP: Diagnosis: NEGATIVE

## 2017-09-10 ENCOUNTER — Ambulatory Visit
Admission: RE | Admit: 2017-09-10 | Discharge: 2017-09-10 | Disposition: A | Discharge status: Home / Self Care | Payer: BC Managed Care – PPO | Source: Ambulatory Visit | Attending: Internal Medicine | Admitting: Internal Medicine

## 2017-09-10 ENCOUNTER — Other Ambulatory Visit: Payer: Self-pay | Admitting: Internal Medicine

## 2017-09-10 DIAGNOSIS — M25562 Pain in left knee: Secondary | ICD-10-CM

## 2019-05-28 IMAGING — CR DG KNEE 1-2V*L*
2 series · 2 of 2 positions shown · non-contrast
Comparison: None.

CLINICAL DATA: Lyme disease.  Joint pain.

EXAM:
LEFT KNEE - 1-2 VIEW

[t knee ap left]
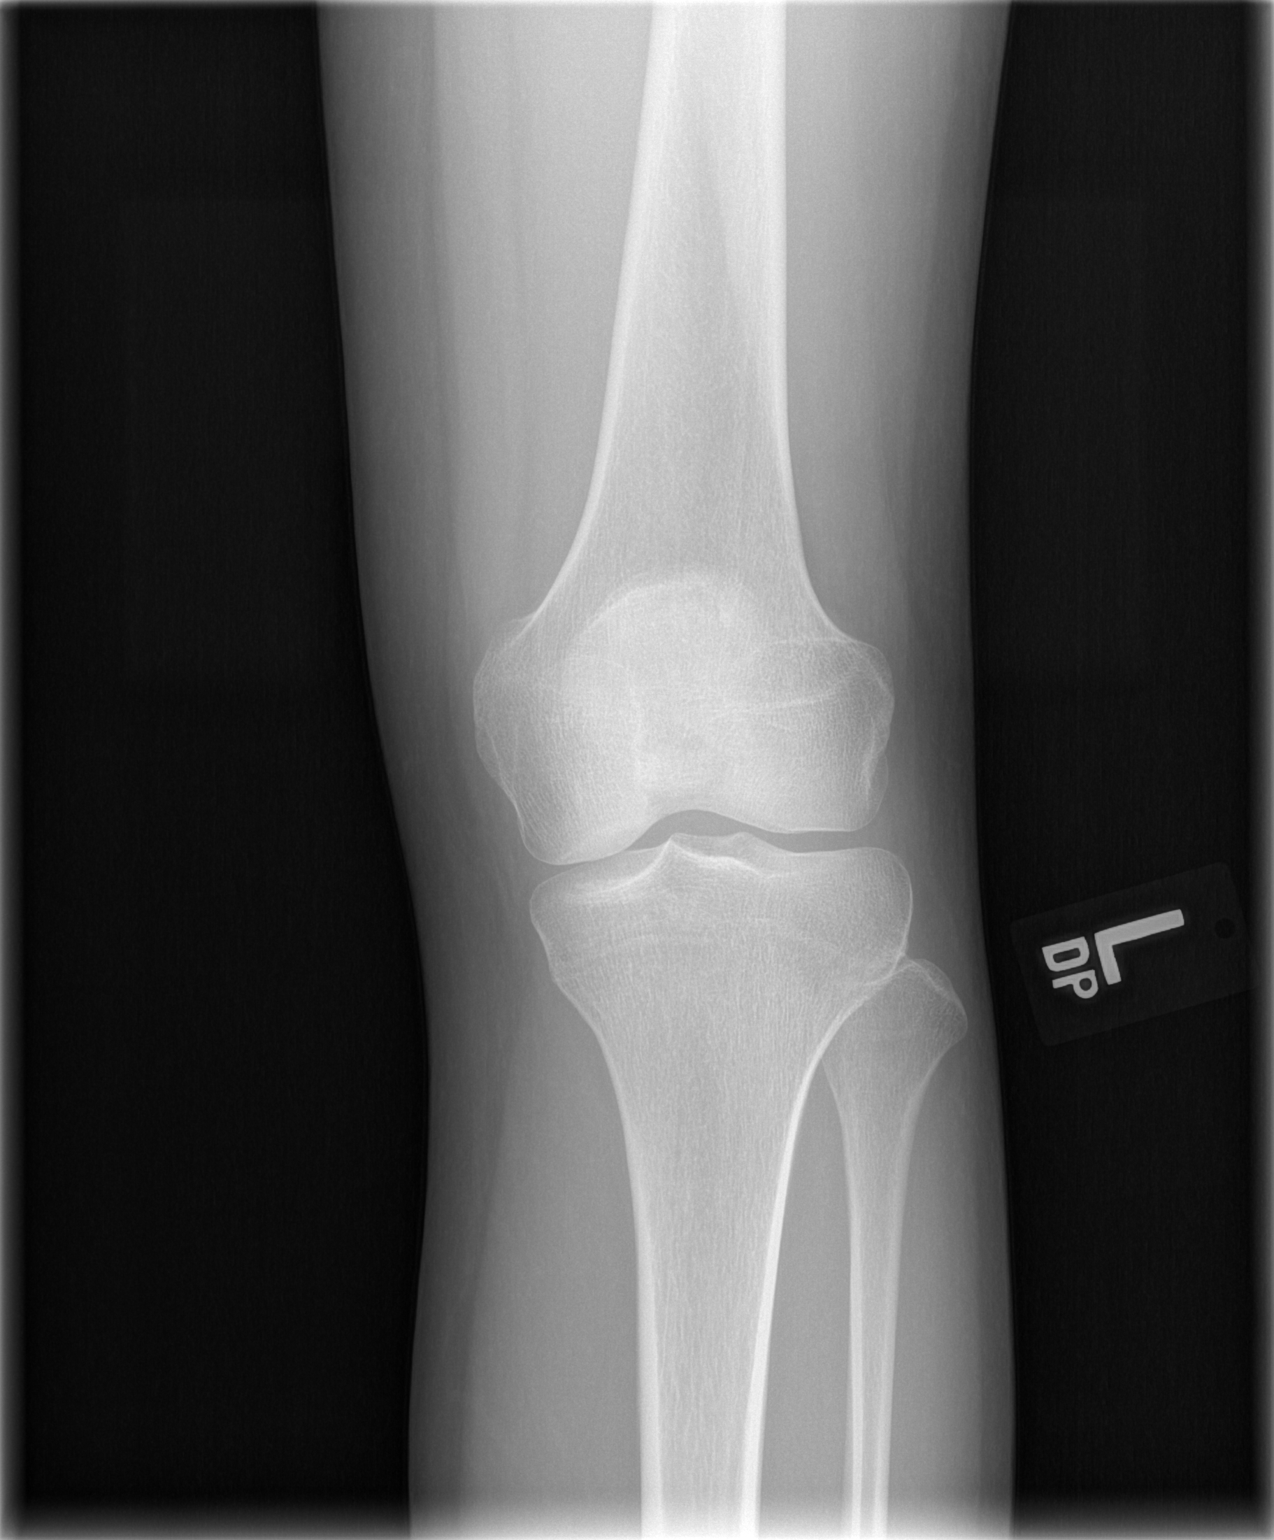

[t knee lat left]
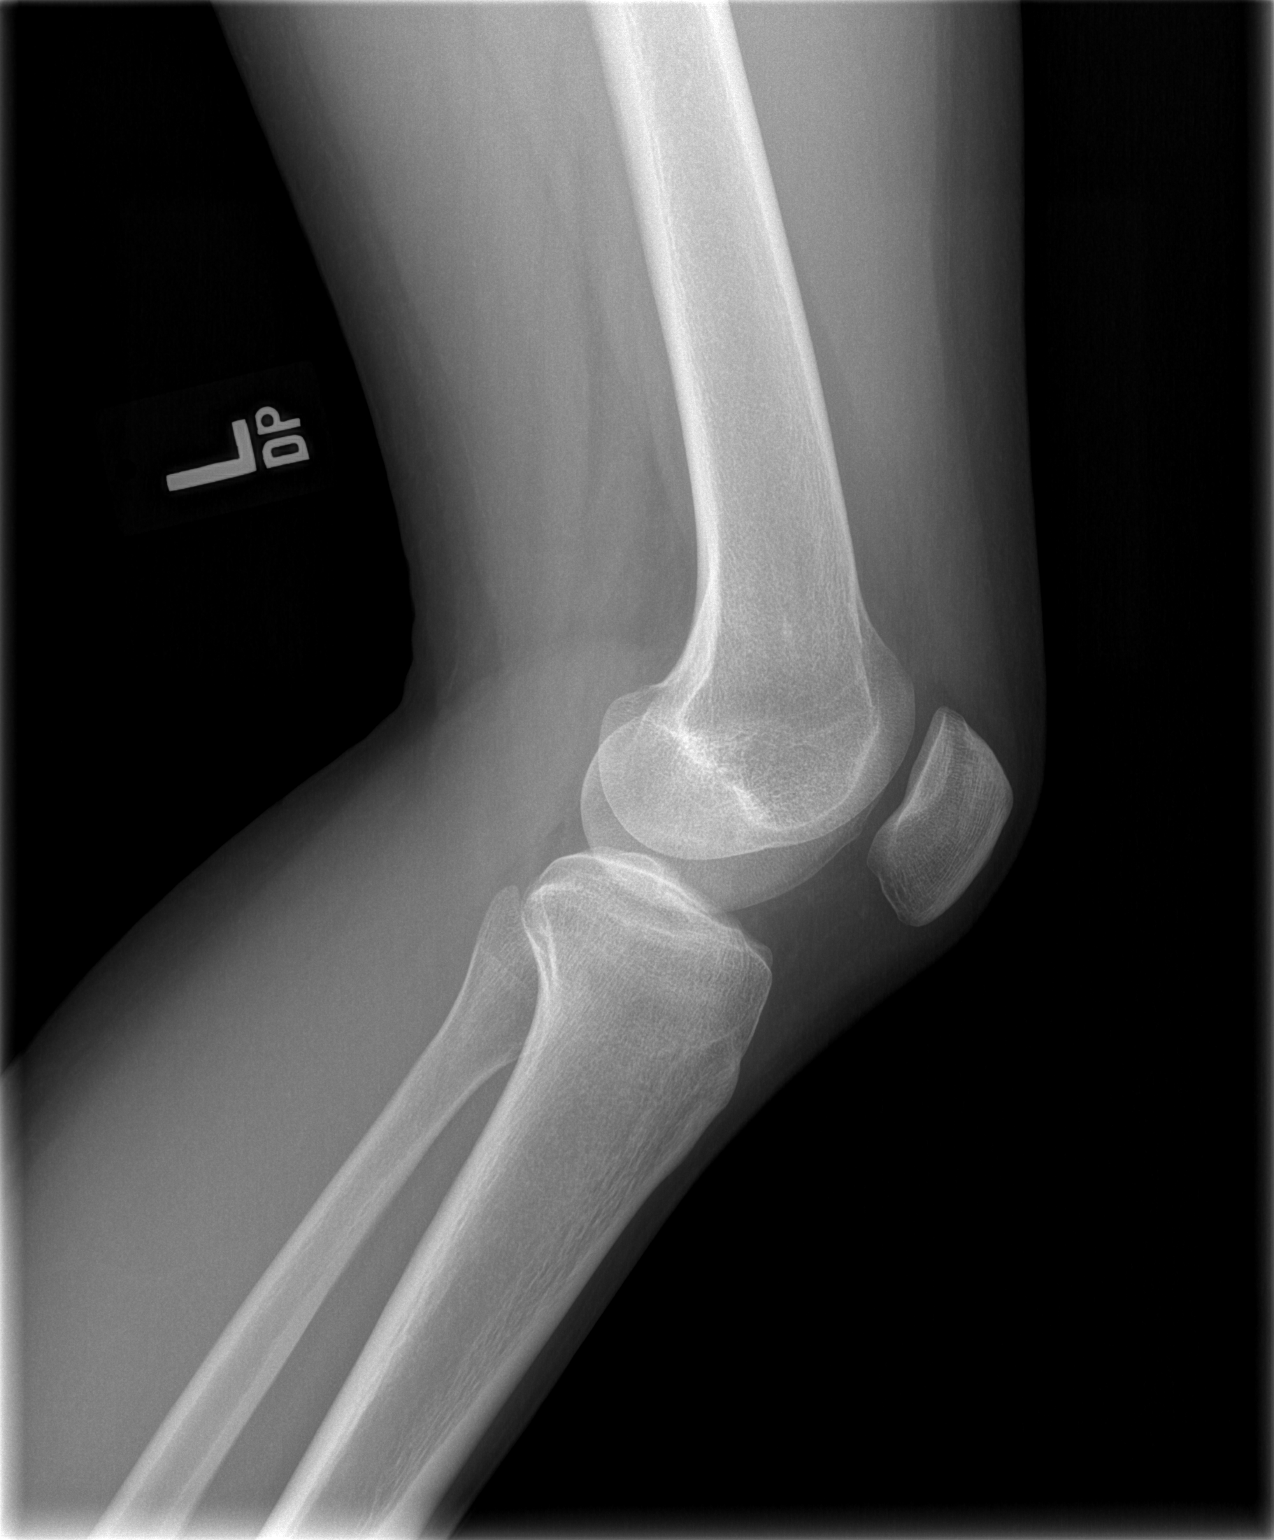

[2 of 2 positions shown; findings below may reference images not displayed]

FINDINGS: No evidence of fracture, dislocation, or joint effusion. No evidence
of arthropathy or other focal bone abnormality. Soft tissues are
unremarkable.
IMPRESSION: Negative.

## 2019-08-02 ENCOUNTER — Other Ambulatory Visit (HOSPITAL_COMMUNITY)
Admission: RE | Admit: 2019-08-02 | Discharge: 2019-08-02 | Disposition: A | Discharge status: Home / Self Care | Payer: BC Managed Care – PPO | Source: Ambulatory Visit | Attending: Internal Medicine | Admitting: Internal Medicine

## 2019-08-02 ENCOUNTER — Other Ambulatory Visit: Payer: Self-pay | Admitting: Internal Medicine

## 2019-08-02 DIAGNOSIS — Z124 Encounter for screening for malignant neoplasm of cervix: Secondary | ICD-10-CM | POA: Diagnosis not present

## 2019-08-14 LAB — CYTOLOGY - PAP
Chlamydia: NEGATIVE
Comment: NEGATIVE
Comment: NORMAL
Diagnosis: NEGATIVE
Neisseria Gonorrhea: NEGATIVE

## 2019-10-05 ENCOUNTER — Other Ambulatory Visit: Payer: Self-pay

## 2019-10-05 DIAGNOSIS — Z20822 Contact with and (suspected) exposure to covid-19: Secondary | ICD-10-CM

## 2019-10-07 LAB — NOVEL CORONAVIRUS, NAA: SARS-CoV-2, NAA: NOT DETECTED

## 2019-11-15 ENCOUNTER — Ambulatory Visit: Payer: BC Managed Care – PPO | Attending: Internal Medicine

## 2019-11-15 DIAGNOSIS — Z20822 Contact with and (suspected) exposure to covid-19: Secondary | ICD-10-CM

## 2019-11-16 LAB — NOVEL CORONAVIRUS, NAA: SARS-CoV-2, NAA: NOT DETECTED
